# Patient Record
Sex: Female | Born: 1980 | Race: White | Hispanic: No | Marital: Married | Smoking: Current every day smoker
Health system: Southern US, Community
[De-identification: ages and names within clinical notes are randomized; demographics above are authoritative.]

---

## 2003-12-16 ENCOUNTER — Emergency Department: Payer: Self-pay | Admitting: General Practice

## 2003-12-22 ENCOUNTER — Observation Stay: Payer: Self-pay | Admitting: General Surgery

## 2004-04-25 ENCOUNTER — Emergency Department: Payer: Self-pay | Admitting: Emergency Medicine

## 2007-10-05 ENCOUNTER — Emergency Department: Payer: Self-pay | Admitting: Unknown Physician Specialty

## 2008-12-08 ENCOUNTER — Ambulatory Visit: Payer: Self-pay | Admitting: Family Medicine

## 2011-02-16 IMAGING — US ULTRASOUND LEFT BREAST
1 series · 5 of 5 positions shown · non-contrast
Comparison: none

REASON FOR EXAM: left breast mass at 6 oclock
COMMENTS:

PROCEDURE:     US  - US BREAST LEFT  - December 08, 2008 [DATE]
RESULT:       Sonographic evaluation of the left breast centered in the area
of clinical concern shows no definite abnormality in the 6 o'clock region.

[Series 1: ultrasound left breast · 5 of 5 slices shown]
[im 1/5]
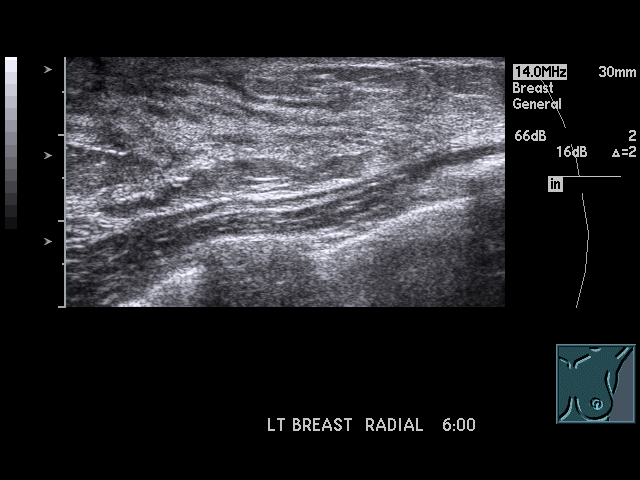
[im 2/5]
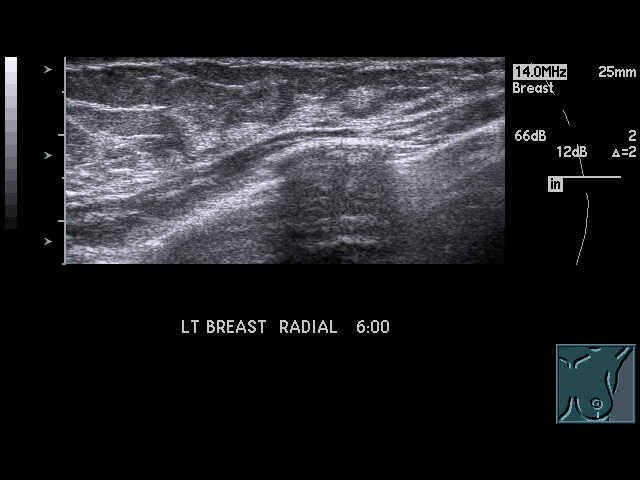
[im 3/5]
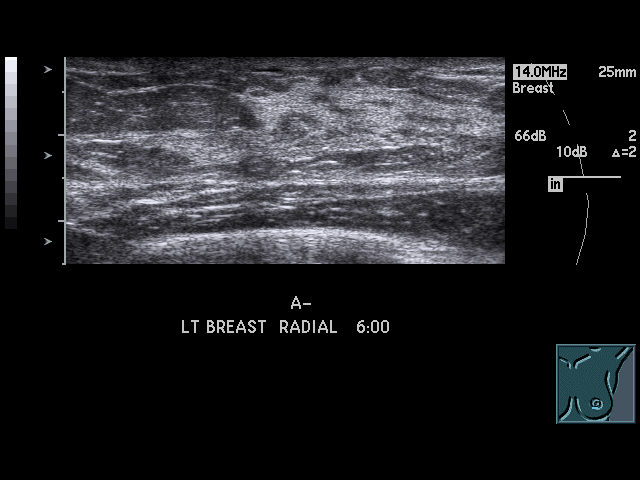
[im 4/5]
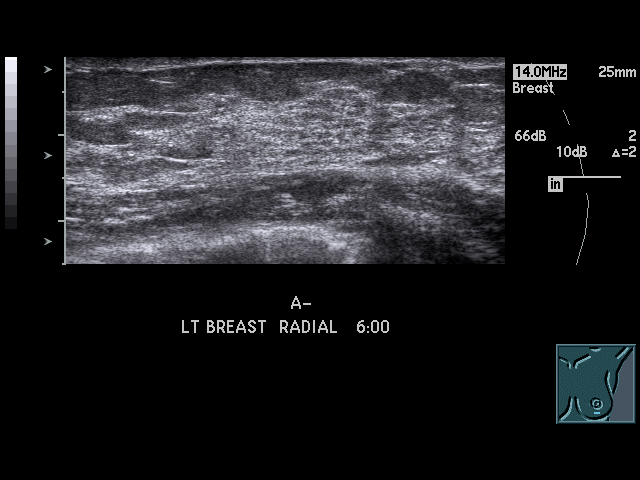
[im 5/5]
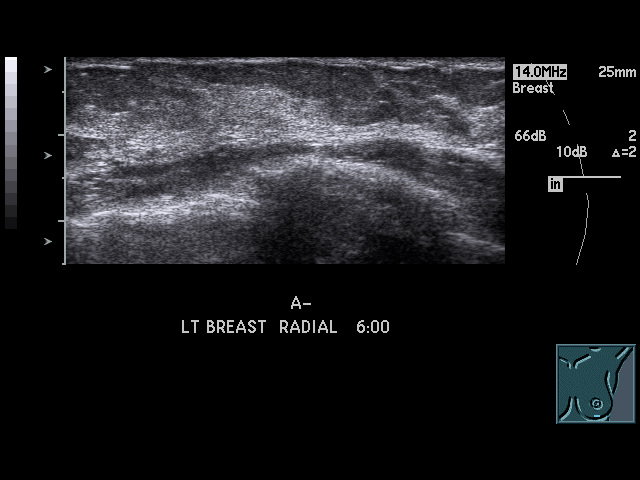

[5 of 5 positions shown; findings below may reference images not displayed]

IMPRESSION: Please see above.

## 2011-10-23 ENCOUNTER — Emergency Department: Payer: Self-pay | Admitting: Emergency Medicine

## 2011-10-24 ENCOUNTER — Emergency Department: Payer: Self-pay | Admitting: Emergency Medicine

## 2017-07-21 ENCOUNTER — Emergency Department
Admission: EM | Admit: 2017-07-21 | Discharge: 2017-07-21 | Disposition: A | Discharge status: Home / Self Care | Payer: Self-pay | Attending: Emergency Medicine | Admitting: Emergency Medicine

## 2017-07-21 ENCOUNTER — Other Ambulatory Visit: Payer: Self-pay

## 2017-07-21 ENCOUNTER — Encounter: Payer: Self-pay | Admitting: Emergency Medicine

## 2017-07-21 DIAGNOSIS — F17200 Nicotine dependence, unspecified, uncomplicated: Secondary | ICD-10-CM | POA: Insufficient documentation

## 2017-07-21 DIAGNOSIS — K047 Periapical abscess without sinus: Secondary | ICD-10-CM | POA: Insufficient documentation

## 2017-07-21 MED ORDER — AMOXICILLIN 500 MG PO CAPS
500.0000 mg | ORAL_CAPSULE | Freq: Once | ORAL | Status: AC
  Administered 2017-07-21: 500 mg via ORAL
  Filled 2017-07-21: qty 1

## 2017-07-21 MED ORDER — AMOXICILLIN 500 MG PO CAPS: 500.0000 mg | ORAL_CAPSULE | Freq: Three times a day (TID) | ORAL | 0 refills | Status: AC

## 2017-07-21 MED ORDER — IBUPROFEN 600 MG PO TABS: 600.0000 mg | ORAL_TABLET | Freq: Four times a day (QID) | ORAL | 0 refills | Status: AC | PRN

## 2017-07-21 MED ORDER — OXYCODONE-ACETAMINOPHEN 5-325 MG PO TABS
1.0000 | ORAL_TABLET | Freq: Once | ORAL | Status: AC
Start: 2017-07-21 — End: 2017-07-21
  Administered 2017-07-21: 1 via ORAL
  Filled 2017-07-21: qty 1

## 2017-07-21 MED ORDER — LIDOCAINE VISCOUS HCL 2 % MT SOLN: 10.0000 mL | OROMUCOSAL | 0 refills | Status: AC | PRN

## 2017-07-21 NOTE — ED Triage Notes (Signed)
Pt to ed with c/o left lower toothache.  Pt states she recently had 2 other teeth removed.  Pt states she has been having pain in that tooth for 2 weeks.

## 2017-07-21 NOTE — Discharge Instructions (Addendum)
OPTIONS FOR DENTAL FOLLOW UP CARE ° °[place] [facility] - Local Safety Net Dental Clinics °http://www.ncdhhs.gov/dph/oralhealth/services/safetynetclinics.htm °  °[facility] (336-562-3123) ° °[facility] [place] (919-933-9087) ° °[facility] ([phone] ext [phone]) ° °[facility] ([phone]) ° °[facility] ([phone]) °This clinic caters to the indigent population and is on a lottery system. °Location: °[facility], [address], [place] °Clinic Hours: °Wednesdays from 6pm - 9pm, patients seen by a lottery system. °For dates, call or go to [email or web address].[facility].edu/[facility]/patients/Dental-[facility] °Services: °Cleanings, fillings and simple extractions. °Payment Options: °DENTAL WORK IS FREE OF CHARGE. Bring proof of income or support. °Best way to get seen: °Arrive at 5:15 pm - this is a lottery, NOT first come/first serve, so arriving earlier will not increase your chances of being seen. °  °  °[facility] °[phone] °Select option 1 for emergencies °  °Location: °[facility], [address], [place] °Clinic Hours: °No walk-ins accepted - call the day before to schedule an appointment. °Check in times are 9:30 am and 1:30 pm. °Services: °Simple extractions, temporary fillings, pulpectomy/pulp debridement, uncomplicated abscess drainage. °Payment Options: °PAYMENT IS DUE AT THE TIME OF SERVICE.  Fee is usually $100-200, additional surgical procedures (e.g. abscess drainage) may be extra. °Cash, checks, Visa/MasterCard accepted.  Can file Medicaid if patient is covered for dental - patient should call case worker to check. °No discount for [facility] patients. °Best way to get seen: °MUST call the day before and get onto the schedule. Can usually be seen the next 1-2 days. No walk-ins accepted. °  °  °[facility] °[phone] °   °Location: °[facility], [address], [place] °Clinic Hours: °M, W, Th, F 8am or 1:30pm, Tues 9a or 1:30 - first come/first served. °Services: °Simple extractions, temporary fillings, uncomplicated abscess drainage.  You do not need to be an [place] resident. °Payment Options: °PAYMENT IS DUE AT THE TIME OF SERVICE. °Dental insurance, otherwise sliding scale - bring proof of income or support. °Depending on income and treatment needed, cost is usually $50-200. °Best way to get seen: °Arrive early as it is first come/first served. °  °  °[facility] °[phone] °  °Location: °[address] °Clinic Hours: °Mon-Thu 8a-5p °Services: °Most basic dental services including extractions and fillings. °Payment Options: °PAYMENT IS DUE AT THE TIME OF SERVICE. °Sliding scale, up to 50% off - bring proof if income or support. °Medicaid with dental option accepted. °Best way to get seen: °Call to schedule an appointment, can usually be seen within 2 weeks OR they will try to see walk-ins - show up at 8a or 2p (you may have to wait). °  °  °[facility] °919-245-2435 °[place] RESIDENTS ONLY °  °Location: °[facility], [address], [place], [place] [address] °Clinic Hours: By appointment only. °Monday - Thursday 8am-5pm, Friday 8am-12pm °Services: Cleanings, fillings, extractions. °Payment Options: °PAYMENT IS DUE AT THE TIME OF SERVICE. °Cash, Visa or MasterCard. Sliding scale - $30 minimum per service. °Best way to get seen: °Come in to office, complete packet and make an appointment - need proof of income °or support monies for each household member and proof of [place] residence. °Usually takes about a month to get in. °  °  °[facility] °[phone] °  °Location: °[address]   [place] °Clinic Hours: Walk-in Urgent Care Dental Services are offered Monday-Friday  mornings only. °The numbers of emergencies accepted daily is limited to the number of °providers available. °Maximum 15 - Mondays, Wednesdays & Thursdays °Maximum 10 - Tuesdays & Fridays °Services: °You do not need to be a [place] resident to be seen for a dental emergency. °Emergencies are defined as pain, swelling, abnormal bleeding, or dental trauma. Walkins will receive x-rays if needed. °NOTE: Dental cleaning is not an emergency. °Payment Options: °PAYMENT IS DUE AT THE TIME OF SERVICE. °Minimum co-pay is $40.00 for uninsured patients. °Minimum co-pay is $3.00 for Medicaid with dental coverage. °Dental Insurance is accepted and must be presented at time of visit. °Medicare does not cover dental. °Forms of payment: Cash, credit card, checks. °Best way to get seen: °If not previously registered with the clinic, walk-in dental registration begins at 7:15 am and is on a first come/first serve basis. °If previously registered with the clinic, call to make an appointment. °  °  °[facility] °[phone] °[place] RESIDENTS ONLY °  °Location: °[address], [place], [place] °Clinic Hours: °Mon-Thu 10a-2p °Services: Extractions only! °Payment Options: °FREE (donations accepted) - bring proof of income or support °Best way to get seen: °Call and schedule an appointment OR come at 8am on the 1st Monday of every month (except for holidays) when it is first come/first served. °  °  °[facility] °[phone] °  °Location: °[address], [place] °Clinic Hours: °Friday mornings °Services, Payment Options, Best way to get seen: °Call for info °

## 2017-07-21 NOTE — ED Notes (Signed)
Pt states she has had a bad tooth for several months on the left lower side, states she was seen before for the pain and was given a nerve block that helped. Pt states she goes to [facility].  Pt states over the past week anything that gets near the tooth causes pain and it is difficult to chew and drink on that side of her mouth. No swelling noted to left side of face

## 2017-07-21 NOTE — ED Provider Notes (Signed)
[facility] Emergency Department Provider Note  ____________________________________________  Time seen: Approximately 7:28 AM  I have reviewed the triage vital signs and the nursing notes.   HISTORY  Chief Complaint Dental Pain    HPI Ariel Schultz is a 37 y.o. female that presents to the emergency department for evaluation of left bottom tooth pain for 2 weeks worsening this week.  She has been using tooth numbing medicine over area.  Patient does not have a dentist.  No fever, chills, facial swelling, nausea, vomiting.   History reviewed. No pertinent past medical history.  There are no active problems to display for this patient.   History reviewed. No pertinent surgical history.  Prior to Admission medications   Medication Sig Start Date End Date Taking? Authorizing Provider  amoxicillin (AMOXIL) 500 MG capsule Take 1 capsule (500 mg total) by mouth 3 (three) times daily. 07/21/17   Enid Derry, PA-C  ibuprofen (ADVIL,MOTRIN) 600 MG tablet Take 1 tablet (600 mg total) by mouth every 6 (six) hours as needed. 07/21/17   Enid Derry, PA-C  lidocaine (XYLOCAINE) 2 % solution Use as directed 10 mLs in the mouth or throat as needed for mouth pain. 07/21/17   Enid Derry, PA-C    Allergies Patient has no known allergies.  History reviewed. No pertinent family history.  Social History Social History   Tobacco Use  . Smoking status: Current Every Day Smoker  . Smokeless tobacco: Never Used  Substance Use Topics  . Alcohol use: Never    Frequency: Never  . Drug use: Never     Review of Systems  Constitutional: No fever/chills Cardiovascular: No chest pain. Respiratory: No SOB. Gastrointestinal: No nausea, no vomiting.  Musculoskeletal: Negative for musculoskeletal pain. Skin: Negative for rash, abrasions, lacerations, ecchymosis. Neurological: Negative for headaches   ____________________________________________   PHYSICAL  EXAM:  VITAL SIGNS: ED Triage Vitals  Enc Vitals Group     BP 07/21/17 0713 122/74     Pulse Rate 07/21/17 0713 95     Resp 07/21/17 0713 18     Temp 07/21/17 0713 98.1 F (36.7 C)     Temp Source 07/21/17 0713 Oral     SpO2 07/21/17 0713 99 %     Weight 07/21/17 0714 120 lb (54.4 kg)     Height 07/21/17 0714 5\' 1"  (1.549 m)     Head Circumference --      Peak Flow --      Pain Score 07/21/17 0713 10     Pain Loc --      Pain Edu? --      Excl. in GC? --      Constitutional: Alert and oriented. Well appearing and in no acute distress. Eyes: Conjunctivae are normal. PERRL. EOMI. Head: Atraumatic. ENT:      Ears:      Nose: No congestion/rhinnorhea.      Mouth/Throat: Mucous membranes are moist. Large cavity to bottom left molar. Tooth numbing gel in place. No swelling. No palpable abscess.  Neck: No stridor.  Cardiovascular: Normal rate, regular rhythm.  Good peripheral circulation. Respiratory: Normal respiratory effort without tachypnea or retractions. Lungs CTAB. Good air entry to the bases with no decreased or absent breath sounds. Musculoskeletal: Full range of motion to all extremities. No gross deformities appreciated. Neurologic:  Normal speech and language. No gross focal neurologic deficits are appreciated.  Skin:  Skin is warm, dry and intact. No rash noted. Psychiatric: Mood and affect are normal. Speech  and behavior are normal. Patient exhibits appropriate insight and judgement.   ____________________________________________   LABS (all labs ordered are listed, but only abnormal results are displayed)  Labs Reviewed - No data to display ____________________________________________  EKG   ____________________________________________  RADIOLOGY   No results found.  ____________________________________________    PROCEDURES  Procedure(s) performed:    Procedures    Medications  oxyCODONE-acetaminophen (PERCOCET/ROXICET) 5-325 MG per  tablet 1 tablet (has no administration in time range)  amoxicillin (AMOXIL) capsule 500 mg (has no administration in time range)     ____________________________________________   INITIAL IMPRESSION / ASSESSMENT AND PLAN / ED COURSE  Pertinent labs & imaging results that were available during my care of the patient were reviewed by me and considered in my medical decision making (see chart for details).  Review of the  [place]RS was performed in accordance of the [facility] prior to dispensing any controlled drugs.   Patient's diagnosis is consistent with dental abscess. Patient will be discharged home with prescriptions for amoxicillin, viscous lidocaine, ibuprofen. Patient is to follow up with dentist as directed. Patient is given ED precautions to return to the ED for any worsening or new symptoms.     ____________________________________________  FINAL CLINICAL IMPRESSION(S) / ED DIAGNOSES  Final diagnoses:  Dental abscess      NEW MEDICATIONS STARTED DURING THIS VISIT:  ED Discharge Orders        Ordered    amoxicillin (AMOXIL) 500 MG capsule  3 times daily     07/21/17 0734    lidocaine (XYLOCAINE) 2 % solution  As needed     07/21/17 0734    ibuprofen (ADVIL,MOTRIN) 600 MG tablet  Every 6 hours PRN     07/21/17 0734          This chart was dictated using voice recognition software/Dragon. Despite best efforts to proofread, errors can occur which can change the meaning. Any change was purely unintentional.    Enid DerryWagner, Shama Monfils, PA-C 07/21/17 1616    Pershing ProudSchaevitz, Myra Rudeavid Matthew, MD 07/22/17 [phone]

## 2022-05-08 ENCOUNTER — Ambulatory Visit: Payer: Medicaid Other

## 2023-09-28 ENCOUNTER — Emergency Department
Admission: EM | Admit: 2023-09-28 | Discharge: 2023-09-28 | Disposition: A | Discharge status: Home / Self Care | Payer: Self-pay | Attending: Emergency Medicine | Admitting: Emergency Medicine

## 2023-09-28 ENCOUNTER — Other Ambulatory Visit: Payer: Self-pay

## 2023-09-28 ENCOUNTER — Encounter: Payer: Self-pay | Admitting: Emergency Medicine

## 2023-09-28 DIAGNOSIS — F419 Anxiety disorder, unspecified: Secondary | ICD-10-CM | POA: Insufficient documentation

## 2023-09-28 DIAGNOSIS — E876 Hypokalemia: Secondary | ICD-10-CM

## 2023-09-28 DIAGNOSIS — F13939 Sedative, hypnotic or anxiolytic use, unspecified with withdrawal, unspecified: Secondary | ICD-10-CM

## 2023-09-28 DIAGNOSIS — F19230 Other psychoactive substance dependence with withdrawal, uncomplicated: Secondary | ICD-10-CM | POA: Insufficient documentation

## 2023-09-28 LAB — COMPREHENSIVE METABOLIC PANEL WITH GFR
ALT: 12 U/L (ref 0–44)
AST: 19 U/L (ref 15–41)
Albumin: 4.7 g/dL (ref 3.5–5.0)
Alkaline Phosphatase: 60 U/L (ref 38–126)
Anion gap: 14 (ref 5–15)
BUN: 8 mg/dL (ref 6–20)
CO2: 22 mmol/L (ref 22–32)
Calcium: 10.1 mg/dL (ref 8.9–10.3)
Chloride: 103 mmol/L (ref 98–111)
Creatinine, Ser: 0.83 mg/dL (ref 0.44–1.00)
GFR, Estimated: >60 mL/min (ref >60)
Glucose, Bld: 105 mg/dL — ABNORMAL HIGH (ref 70–99)
Potassium: 3 mmol/L — ABNORMAL LOW (ref 3.5–5.1)
Sodium: 139 mmol/L (ref 135–145)
Total Bilirubin: 0.8 mg/dL (ref 0.0–1.2)
Total Protein: 8 g/dL (ref 6.5–8.1)

## 2023-09-28 LAB — POC URINE PREG, ED: Preg Test, Ur: NEGATIVE

## 2023-09-28 LAB — CBC
HCT: 32.3 % — ABNORMAL LOW (ref 36.0–46.0)
Hemoglobin: 9.3 g/dL — ABNORMAL LOW (ref 12.0–15.0)
MCH: 22.1 pg — ABNORMAL LOW (ref 26.0–34.0)
MCHC: 28.8 g/dL — ABNORMAL LOW (ref 30.0–36.0)
MCV: 76.9 fL — ABNORMAL LOW (ref 80.0–100.0)
Platelets: 376 K/uL (ref 150–400)
RBC: 4.2 MIL/uL (ref 3.87–5.11)
RDW: 18.6 % — ABNORMAL HIGH (ref 11.5–15.5)
WBC: 7 K/uL (ref 4.0–10.5)
nRBC: 0 % (ref 0.0–0.2)

## 2023-09-28 LAB — URINE DRUG SCREEN, QUALITATIVE (ARMC ONLY)
Amphetamines, Ur Screen: NOT DETECTED
Barbiturates, Ur Screen: POSITIVE — AB
Benzodiazepine, Ur Scrn: NOT DETECTED
Cannabinoid 50 Ng, Ur ~~LOC~~: POSITIVE — AB
Cocaine Metabolite,Ur ~~LOC~~: NOT DETECTED
MDMA (Ecstasy)Ur Screen: NOT DETECTED
Methadone Scn, Ur: NOT DETECTED
Opiate, Ur Screen: NOT DETECTED
Phencyclidine (PCP) Ur S: NOT DETECTED
Tricyclic, Ur Screen: NOT DETECTED

## 2023-09-28 LAB — ETHANOL: Alcohol, Ethyl (B): <15 mg/dL (ref <15)

## 2023-09-28 MED ORDER — PHENOBARBITAL SODIUM 130 MG/ML IJ SOLN
130.0000 mg | Freq: Once | INTRAMUSCULAR | Status: AC
  Administered 2023-09-28: 130 mg via INTRAVENOUS
  Filled 2023-09-28: qty 1

## 2023-09-28 MED ORDER — POTASSIUM CHLORIDE CRYS ER 20 MEQ PO TBCR
40.0000 meq | EXTENDED_RELEASE_TABLET | Freq: Once | ORAL | Status: AC
  Administered 2023-09-28: 40 meq via ORAL
  Filled 2023-09-28: qty 2

## 2023-09-28 NOTE — ED Provider Notes (Addendum)
 8:18 AM patient has resolution of symptoms.  She continues to request discharge home.  She reports resolution of symptoms.  She denies any SI and states that she does not plan to continue using any benzodiazepines at home.  She understands that she is at risk for seizures, worsening symptoms but hopefully the phenobarbital  will help prevent this.  She expressed understanding and stated that she return to the ER if she develop any return of symptoms or worsening symptoms or any other concerns.  Patient is amblatory without any ataxia.  She reports a history of tubal ligation but pregnancy test was negative.  EKG is sinus bradycardia rate of 57 without any ST elevation or T wave inversions, normal intervals  Pregnancy test was negative    I did confirm with pharmacy and that there is no mandated amount of time that we need to monitor patient after getting phenobarbital .  It has been an hour and that she reports feeling much improved and her vitals are stable and she is requesting and comfortable with discharge home   Ernest Ronal BRAVO, MD 09/28/23 [phone]

## 2023-09-28 NOTE — ED Triage Notes (Addendum)
 Patient c/o withdrawals from xanax.  Patient last took xanax 3 days ago.  Patient normally takes 3/4 of a white bar xanax a day.  Patient c/o confusion, rapid heart rate, feels like she can't breathe, headache, anxiety, depression, tremors, and hot/cold flashes. 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

## 2023-09-28 NOTE — Discharge Instructions (Signed)
Return to the ER for worsening symptoms or any other concerns

## 2023-09-28 NOTE — ED Provider Notes (Signed)
 [facility] Provider Note    Event Date/Time   First MD Initiated Contact with Patient 09/28/23 [phone]     (approximate)   History   No chief complaint on file.   HPI  Ariel Schultz is a 43 y.o. female who presents to the ED for evaluation of No chief complaint on file.   Patient presents to the ED with her husband with concerns for benzodiazepine withdrawals.  Due to history of anxiety she has been using Xanax to manage her symptoms.  Initially prescribed by physician, but over the past few years has been buying them illicitly.  No coingestions, ethanol or other recreational drug use.  Last use was 3 days ago but she has since run out and reports feeling increasingly anxious, jittery  They do report a witnessed episode of generalized tonic-clonic seizure that occurred last week, witnessed by husband.  Lasting 5 to 10 minutes and self resolving.  They called EMS who evaluated the patient, she returned to baseline and did not want to be transported at that time.   Physical Exam   Triage Vital Signs: ED Triage Vitals  Encounter Vitals Group     BP 09/28/23 0544 (!) 150/94     Girls Systolic BP Percentile --      Girls Diastolic BP Percentile --      Boys Systolic BP Percentile --      Boys Diastolic BP Percentile --      Pulse Rate 09/28/23 0544 83     Resp 09/28/23 0544 20     Temp 09/28/23 0544 98.2 F (36.8 C)     Temp Source 09/28/23 0544 Oral     SpO2 09/28/23 0544 100 %     Weight 09/28/23 0543 130 lb (59 kg)     Height --      Head Circumference --      Peak Flow --      Pain Score 09/28/23 0543 10     Pain Loc --      Pain Education --      Exclude from Growth Chart --     Most recent vital signs: Vitals:   09/28/23 0630 09/28/23 0700  BP: 124/89 127/85  Pulse:  (!) 51  Resp: 13 15  Temp:    SpO2:  100%    General: Awake, no distress.  CV:  Good peripheral perfusion.  Resp:  Normal effort.  Abd:  No distention.  MSK:  No  deformity noted.  Neuro:  No focal deficits appreciated. Other:     ED Results / Procedures / Treatments   Labs (all labs ordered are listed, but only abnormal results are displayed) Labs Reviewed  COMPREHENSIVE METABOLIC PANEL WITH GFR - Abnormal; Notable for the following components:      Result Value   Potassium 3.0 (*)    Glucose, Bld 105 (*)    All other components within normal limits  CBC - Abnormal; Notable for the following components:   Hemoglobin 9.3 (*)    HCT 32.3 (*)    MCV 76.9 (*)    MCH 22.1 (*)    MCHC 28.8 (*)    RDW 18.6 (*)    All other components within normal limits  ETHANOL  URINE DRUG SCREEN, QUALITATIVE ([facility] ONLY)  POC URINE PREG, ED    EKG   RADIOLOGY   Official radiology report(s): No results found.  PROCEDURES and INTERVENTIONS:  .Critical Care  Performed by: Claudene Rover, MD  Authorized by: Claudene Rover, MD   Critical care provider statement:    Critical care time (minutes):  30   Critical care time was exclusive of:  Separately billable procedures and treating other patients   Critical care was necessary to treat or prevent imminent or life-threatening deterioration of the following conditions:  Toxidrome   Critical care was time spent personally by me on the following activities:  Development of treatment plan with patient or surrogate, discussions with consultants, evaluation of patient's response to treatment, examination of patient, ordering and review of laboratory studies, ordering and review of radiographic studies, ordering and performing treatments and interventions, pulse oximetry, re-evaluation of patient's condition and review of old charts   Medications  PHENObarbital  (LUMINAL) injection 130 mg (130 mg Intravenous Given 09/28/23 0622)  PHENObarbital  (LUMINAL) injection 130 mg (130 mg Intravenous Given 09/28/23 0737)  potassium chloride  SA (KLOR-CON  M) CR tablet 40 mEq (40 mEq Oral Given 09/28/23 0738)     IMPRESSION /  MDM / ASSESSMENT AND PLAN / ED COURSE  I reviewed the triage vital signs and the nursing notes.  Differential diagnosis includes, but is not limited to, seizure, status epilepticus, ICH, trauma, benzo withdrawals, polysubstance abuse  {Patient presents with symptoms of an acute illness or injury that is potentially life-threatening.  Patient presents with symptoms of benzodiazepine withdrawals.  No seizure activity here but they provide a good story of a witnessed generalized tonic-clonic seizure that occurred last week at home.  She has no suicidal or homicidal thoughts, no indication for IVC or emergent psychiatric evaluation.  She is quite eager to go home so we will try to facilitate this with loading with phenobarbital  and close outpatient follow-up.  Mild hypokalemia, replaced orally.  Microcytic anemia, likely chronic, no bleeding symptoms.  She is feeling better around the time of signout to oncoming physician.  Will plan for outpatient management after reassessment.  Clinical Course as of 09/28/23 0744  Fri Sep 28, 2023  0718 Reassessed, looks much better after the first dose of phenobarbital , suspect she would benefit from 1 more dose.  We discussed plan of care and she is still eager to go home.  We discussed long half-life of this medication, expectant management and close return precautions.  They are agreeable and appreciative. [DS]    Clinical Course User Index [DS] Claudene Rover, MD     FINAL CLINICAL IMPRESSION(S) / ED DIAGNOSES   Final diagnoses:  None     Rx / DC Orders   ED Discharge Orders     None        Note:  This document was prepared using Dragon voice recognition software and may include unintentional dictation errors.   Claudene Rover, MD 09/28/23 [phone]

## 2023-09-28 NOTE — ED Notes (Signed)
 Urine preg negative

## 2023-10-04 ENCOUNTER — Other Ambulatory Visit: Payer: Self-pay

## 2023-10-04 ENCOUNTER — Emergency Department
Admission: EM | Admit: 2023-10-04 | Discharge: 2023-10-04 | Disposition: A | Discharge status: Home / Self Care | Payer: Self-pay | Attending: Emergency Medicine | Admitting: Emergency Medicine

## 2023-10-04 ENCOUNTER — Encounter: Payer: Self-pay | Admitting: Emergency Medicine

## 2023-10-04 DIAGNOSIS — D649 Anemia, unspecified: Secondary | ICD-10-CM | POA: Insufficient documentation

## 2023-10-04 DIAGNOSIS — F19230 Other psychoactive substance dependence with withdrawal, uncomplicated: Secondary | ICD-10-CM | POA: Insufficient documentation

## 2023-10-04 DIAGNOSIS — F13930 Sedative, hypnotic or anxiolytic use, unspecified with withdrawal, uncomplicated: Secondary | ICD-10-CM

## 2023-10-04 LAB — CBC WITH DIFFERENTIAL/PLATELET
Abs Immature Granulocytes: 0.01 K/uL (ref 0.00–0.07)
Basophils Absolute: 0.1 K/uL (ref 0.0–0.1)
Basophils Relative: 1 %
Eosinophils Absolute: 0.1 K/uL (ref 0.0–0.5)
Eosinophils Relative: 1 %
HCT: 32 % — ABNORMAL LOW (ref 36.0–46.0)
Hemoglobin: 9.4 g/dL — ABNORMAL LOW (ref 12.0–15.0)
Immature Granulocytes: 0 %
Lymphocytes Relative: 37 %
Lymphs Abs: 2.6 K/uL (ref 0.7–4.0)
MCH: 22.8 pg — ABNORMAL LOW (ref 26.0–34.0)
MCHC: 29.4 g/dL — ABNORMAL LOW (ref 30.0–36.0)
MCV: 77.5 fL — ABNORMAL LOW (ref 80.0–100.0)
Monocytes Absolute: 0.6 K/uL (ref 0.1–1.0)
Monocytes Relative: 9 %
Neutro Abs: 3.7 K/uL (ref 1.7–7.7)
Neutrophils Relative %: 52 %
Platelets: 323 K/uL (ref 150–400)
RBC: 4.13 MIL/uL (ref 3.87–5.11)
RDW: 19.3 % — ABNORMAL HIGH (ref 11.5–15.5)
WBC: 7.1 K/uL (ref 4.0–10.5)
nRBC: 0 % (ref 0.0–0.2)

## 2023-10-04 LAB — BASIC METABOLIC PANEL WITH GFR
Anion gap: 14 (ref 5–15)
BUN: 7 mg/dL (ref 6–20)
CO2: 24 mmol/L (ref 22–32)
Calcium: 10 mg/dL (ref 8.9–10.3)
Chloride: 100 mmol/L (ref 98–111)
Creatinine, Ser: 0.77 mg/dL (ref 0.44–1.00)
GFR, Estimated: >60 mL/min (ref >60)
Glucose, Bld: 111 mg/dL — ABNORMAL HIGH (ref 70–99)
Potassium: 3.5 mmol/L (ref 3.5–5.1)
Sodium: 138 mmol/L (ref 135–145)

## 2023-10-04 LAB — MAGNESIUM: Magnesium: 2.1 mg/dL (ref 1.7–2.4)

## 2023-10-04 MED ORDER — CHLORDIAZEPOXIDE HCL 25 MG PO CAPS: ORAL_CAPSULE | ORAL | 0 refills | Status: AC

## 2023-10-04 MED ORDER — CHLORDIAZEPOXIDE HCL 25 MG PO CAPS
50.0000 mg | ORAL_CAPSULE | Freq: Once | ORAL | Status: AC
  Administered 2023-10-04: 50 mg via ORAL
  Filled 2023-10-04: qty 2

## 2023-10-04 NOTE — ED Provider Notes (Signed)
 [facility] Provider Note    Event Date/Time   First MD Initiated Contact with Patient 10/04/23 2200     (approximate)   History   Drug Problem   HPI  Ariel Schultz is a 43 y.o. female with a history of a Zaidi who presents with concern for withdrawal from Xanax.  The patient states that she last took it around 9 days ago.  She presented to the ED on 8/15 reporting withdrawal symptoms and a seizure.  She was given 2 doses of phenobarbital  but wanted to be discharged home.  Since that time she has not taken any medications.  She reports intermittent episodes of palpitations, of last heart rate, lightheadedness, feeling like she is going to pass out, anxiety episodes, and tremors.  These episodes will come and go lasting usually for a few minutes at a time.  She denies any chest pain.  She denies any vomiting or diarrhea.  I reviewed the past medical records.  I confirmed that the patient was seen in the ED on 8/15 with a report of benzodiazepine withdrawal and was treated with phenobarbital .  Her only prior visit in our system was in 2019 for dental pain.   Physical Exam   Triage Vital Signs: ED Triage Vitals  Encounter Vitals Group     BP 10/04/23 1744 (!) 159/103     Girls Systolic BP Percentile --      Girls Diastolic BP Percentile --      Boys Systolic BP Percentile --      Boys Diastolic BP Percentile --      Pulse Rate 10/04/23 1744 77     Resp 10/04/23 1744 18     Temp 10/04/23 1744 98.4 F (36.9 C)     Temp Source 10/04/23 1744 Oral     SpO2 10/04/23 1744 100 %     Weight 10/04/23 1746 129 lb 13.6 oz (58.9 kg)     Height 10/04/23 1746 5' 1 (1.549 m)     Head Circumference --      Peak Flow --      Pain Score 10/04/23 1746 0     Pain Loc --      Pain Education --      Exclude from Growth Chart --     Most recent vital signs: Vitals:   10/04/23 1744 10/04/23 2244  BP: (!) 159/103 (!) 161/91  Pulse: 77 74  Resp: 18 20  Temp: 98.4 F  (36.9 C) 98.4 F (36.9 C)  SpO2: 100% 99%     General: Alert and oriented, no distress.  CV:  Good peripheral perfusion.  Resp:  Normal effort.  Abd:  No distention.  Other:  EOMI.  PERRLA.  No photophobia.  Mild tongue fasciculation.  No tremor or asterixis.   ED Results / Procedures / Treatments   Labs (all labs ordered are listed, but only abnormal results are displayed) Labs Reviewed  BASIC METABOLIC PANEL WITH GFR - Abnormal; Notable for the following components:      Result Value   Glucose, Bld 111 (*)    All other components within normal limits  CBC WITH DIFFERENTIAL/PLATELET - Abnormal; Notable for the following components:   Hemoglobin 9.4 (*)    HCT 32.0 (*)    MCV 77.5 (*)    MCH 22.8 (*)    MCHC 29.4 (*)    RDW 19.3 (*)    All other components within normal limits  MAGNESIUM  EKG  ED ECG REPORT I, Waylon Cassis, the attending physician, personally viewed and interpreted this ECG.  Date: 10/04/2023 EKG Time: 2321 Rate: 51 Rhythm: normal sinus rhythm QRS Axis: normal Intervals: normal ST/T Wave abnormalities: normal (machine indicates nonspecific T wave abnormalities although this appears to be due to poor EKG baseline) Narrative Interpretation: no evidence of acute ischemia; no significant change when compared to EKG of 09/28/2023    RADIOLOGY   PROCEDURES:  Critical Care performed: No  Procedures   MEDICATIONS ORDERED IN ED: Medications  chlordiazePOXIDE  (LIBRIUM ) capsule 50 mg (50 mg Oral Given 10/04/23 2240)     IMPRESSION / MDM / ASSESSMENT AND PLAN / ED COURSE  I reviewed the triage vital signs and the nursing notes.  43 year old female with PMH as noted above presents with concern for benzodiazepine withdrawal after last taking Xanax about 9 days ago, with continued episodes of palpitations, lightheadedness, and acute anxiety.  On exam she has very mild tongue fasciculation and is hypertensive with otherwise normal vital  signs and no concerning exam findings.  Differential diagnosis includes, but is not limited to, benzodiazepine withdrawal, acute anxiety, dehydration, electrolyte abnormality, other metabolic etiology.  We will obtain basic labs, magnesium level, and EKG.  I feel that the patient would most benefit from a p.o. Librium  taper.  There is no indication for phenobarbital  at this time.  Patient's presentation is most consistent with acute complicated illness / injury requiring diagnostic workup.  ----------------------------------------- 11:29 PM on 10/04/2023 -----------------------------------------  EKG is unremarkable.  CBC shows unchanged anemia.  BMP shows no acute abnormalities.  Magnesium is normal.  The patient is feeling better with Librium .  She is stable for discharge home at this time.  I have prescribed a Librium  taper.  I gave strict return precautions, and she expressed understanding.   FINAL CLINICAL IMPRESSION(S) / ED DIAGNOSES   Final diagnoses:  Benzodiazepine withdrawal without complication (HCC)     Rx / DC Orders   ED Discharge Orders          Ordered    chlordiazePOXIDE  (LIBRIUM ) 25 MG capsule        10/04/23 2235             Note:  This document was prepared using Dragon voice recognition software and may include unintentional dictation errors.    Cassis Waylon, MD 10/04/23 2330

## 2023-10-04 NOTE — Discharge Instructions (Signed)
 Take the Librium  taper as prescribed.  Return to the ER for new, worsening, or persistent severe palpitations, anxiety, or other withdrawal symptoms, or any other new or worsening symptoms that concern you.

## 2023-10-04 NOTE — ED Triage Notes (Signed)
 Pt presents to the ED via POV with complaints of withdrawals from Xanax. She notes not taking xanax for the last week.  Pt notes getting phenobarbital  last week and it was helpful for the last 3-4 days. A&Ox4 at this time. Denies dizziness, headaches,  CP or SOB.
